# Patient Record
Sex: Female | Born: 1960 | Race: White | Hispanic: No | Marital: Married
Health system: Southern US, Community
[De-identification: ages and names within clinical notes are randomized; demographics above are authoritative.]

---

## 2017-10-26 DIAGNOSIS — M65312 Trigger thumb, left thumb: Secondary | ICD-10-CM | POA: Diagnosis not present

## 2017-10-26 DIAGNOSIS — M79642 Pain in left hand: Secondary | ICD-10-CM | POA: Diagnosis not present

## 2017-10-26 DIAGNOSIS — M1812 Unilateral primary osteoarthritis of first carpometacarpal joint, left hand: Secondary | ICD-10-CM | POA: Diagnosis not present

## 2017-11-08 DIAGNOSIS — Z1231 Encounter for screening mammogram for malignant neoplasm of breast: Secondary | ICD-10-CM | POA: Diagnosis not present

## 2017-11-08 DIAGNOSIS — Z1331 Encounter for screening for depression: Secondary | ICD-10-CM | POA: Diagnosis not present

## 2017-11-08 DIAGNOSIS — N951 Menopausal and female climacteric states: Secondary | ICD-10-CM | POA: Diagnosis not present

## 2017-11-08 DIAGNOSIS — Z1211 Encounter for screening for malignant neoplasm of colon: Secondary | ICD-10-CM | POA: Diagnosis not present

## 2017-11-08 DIAGNOSIS — Z0000 Encounter for general adult medical examination without abnormal findings: Secondary | ICD-10-CM | POA: Diagnosis not present

## 2017-11-23 DIAGNOSIS — Z1231 Encounter for screening mammogram for malignant neoplasm of breast: Secondary | ICD-10-CM | POA: Diagnosis not present

## 2018-05-02 DIAGNOSIS — N3000 Acute cystitis without hematuria: Secondary | ICD-10-CM | POA: Diagnosis not present

## 2018-05-02 DIAGNOSIS — E663 Overweight: Secondary | ICD-10-CM | POA: Diagnosis not present

## 2018-05-02 DIAGNOSIS — Z780 Asymptomatic menopausal state: Secondary | ICD-10-CM | POA: Diagnosis not present

## 2018-05-06 DIAGNOSIS — N390 Urinary tract infection, site not specified: Secondary | ICD-10-CM | POA: Diagnosis not present

## 2018-05-06 DIAGNOSIS — Z79899 Other long term (current) drug therapy: Secondary | ICD-10-CM | POA: Diagnosis not present

## 2018-05-06 DIAGNOSIS — N951 Menopausal and female climacteric states: Secondary | ICD-10-CM | POA: Diagnosis not present

## 2018-05-06 DIAGNOSIS — B373 Candidiasis of vulva and vagina: Secondary | ICD-10-CM | POA: Diagnosis not present

## 2018-05-13 DIAGNOSIS — N390 Urinary tract infection, site not specified: Secondary | ICD-10-CM | POA: Diagnosis not present

## 2018-05-25 DIAGNOSIS — M25631 Stiffness of right wrist, not elsewhere classified: Secondary | ICD-10-CM | POA: Diagnosis not present

## 2018-05-25 DIAGNOSIS — M25531 Pain in right wrist: Secondary | ICD-10-CM | POA: Diagnosis not present

## 2018-07-05 DIAGNOSIS — M25531 Pain in right wrist: Secondary | ICD-10-CM | POA: Diagnosis not present

## 2018-07-05 DIAGNOSIS — M25631 Stiffness of right wrist, not elsewhere classified: Secondary | ICD-10-CM | POA: Diagnosis not present

## 2018-07-06 DIAGNOSIS — M25531 Pain in right wrist: Secondary | ICD-10-CM | POA: Diagnosis not present

## 2018-07-06 DIAGNOSIS — M25631 Stiffness of right wrist, not elsewhere classified: Secondary | ICD-10-CM | POA: Diagnosis not present

## 2018-07-12 DIAGNOSIS — M25631 Stiffness of right wrist, not elsewhere classified: Secondary | ICD-10-CM | POA: Diagnosis not present

## 2018-07-12 DIAGNOSIS — M25531 Pain in right wrist: Secondary | ICD-10-CM | POA: Diagnosis not present

## 2018-09-07 DIAGNOSIS — J028 Acute pharyngitis due to other specified organisms: Secondary | ICD-10-CM | POA: Diagnosis not present

## 2018-09-07 DIAGNOSIS — Z6824 Body mass index (BMI) 24.0-24.9, adult: Secondary | ICD-10-CM | POA: Diagnosis not present

## 2018-11-28 DIAGNOSIS — Z1231 Encounter for screening mammogram for malignant neoplasm of breast: Secondary | ICD-10-CM | POA: Diagnosis not present

## 2018-11-28 DIAGNOSIS — Z1331 Encounter for screening for depression: Secondary | ICD-10-CM | POA: Diagnosis not present

## 2018-11-28 DIAGNOSIS — Z0000 Encounter for general adult medical examination without abnormal findings: Secondary | ICD-10-CM | POA: Diagnosis not present

## 2018-11-28 DIAGNOSIS — E2839 Other primary ovarian failure: Secondary | ICD-10-CM | POA: Diagnosis not present

## 2018-12-30 DIAGNOSIS — Z1231 Encounter for screening mammogram for malignant neoplasm of breast: Secondary | ICD-10-CM | POA: Diagnosis not present

## 2018-12-30 DIAGNOSIS — M8589 Other specified disorders of bone density and structure, multiple sites: Secondary | ICD-10-CM | POA: Diagnosis not present

## 2018-12-30 DIAGNOSIS — Z13820 Encounter for screening for osteoporosis: Secondary | ICD-10-CM | POA: Diagnosis not present

## 2018-12-31 DIAGNOSIS — Z23 Encounter for immunization: Secondary | ICD-10-CM | POA: Diagnosis not present

## 2019-02-04 DIAGNOSIS — Z885 Allergy status to narcotic agent status: Secondary | ICD-10-CM | POA: Diagnosis not present

## 2019-02-04 DIAGNOSIS — M47812 Spondylosis without myelopathy or radiculopathy, cervical region: Secondary | ICD-10-CM | POA: Diagnosis not present

## 2019-02-04 DIAGNOSIS — S4992XA Unspecified injury of left shoulder and upper arm, initial encounter: Secondary | ICD-10-CM | POA: Diagnosis not present

## 2019-02-04 DIAGNOSIS — Y92814 Boat as the place of occurrence of the external cause: Secondary | ICD-10-CM | POA: Diagnosis not present

## 2019-02-04 DIAGNOSIS — M542 Cervicalgia: Secondary | ICD-10-CM | POA: Diagnosis not present

## 2019-02-04 DIAGNOSIS — K0889 Other specified disorders of teeth and supporting structures: Secondary | ICD-10-CM | POA: Diagnosis not present

## 2019-02-04 DIAGNOSIS — S0993XA Unspecified injury of face, initial encounter: Secondary | ICD-10-CM | POA: Diagnosis not present

## 2019-02-04 DIAGNOSIS — X58XXXA Exposure to other specified factors, initial encounter: Secondary | ICD-10-CM | POA: Diagnosis not present

## 2019-02-04 DIAGNOSIS — R6884 Jaw pain: Secondary | ICD-10-CM | POA: Diagnosis not present

## 2019-02-04 DIAGNOSIS — S0083XA Contusion of other part of head, initial encounter: Secondary | ICD-10-CM | POA: Diagnosis not present

## 2019-02-04 DIAGNOSIS — M25512 Pain in left shoulder: Secondary | ICD-10-CM | POA: Diagnosis not present

## 2019-02-04 DIAGNOSIS — Z791 Long term (current) use of non-steroidal anti-inflammatories (NSAID): Secondary | ICD-10-CM | POA: Diagnosis not present

## 2019-02-08 DIAGNOSIS — Z780 Asymptomatic menopausal state: Secondary | ICD-10-CM | POA: Diagnosis not present

## 2019-02-08 DIAGNOSIS — Z6823 Body mass index (BMI) 23.0-23.9, adult: Secondary | ICD-10-CM | POA: Diagnosis not present

## 2019-02-08 DIAGNOSIS — B373 Candidiasis of vulva and vagina: Secondary | ICD-10-CM | POA: Diagnosis not present

## 2019-03-08 DIAGNOSIS — M542 Cervicalgia: Secondary | ICD-10-CM | POA: Diagnosis not present

## 2019-03-20 DIAGNOSIS — M542 Cervicalgia: Secondary | ICD-10-CM | POA: Diagnosis not present

## 2019-03-20 DIAGNOSIS — M2560 Stiffness of unspecified joint, not elsewhere classified: Secondary | ICD-10-CM | POA: Diagnosis not present

## 2019-03-28 DIAGNOSIS — M2560 Stiffness of unspecified joint, not elsewhere classified: Secondary | ICD-10-CM | POA: Diagnosis not present

## 2019-03-28 DIAGNOSIS — M542 Cervicalgia: Secondary | ICD-10-CM | POA: Diagnosis not present

## 2019-03-30 DIAGNOSIS — Z780 Asymptomatic menopausal state: Secondary | ICD-10-CM | POA: Diagnosis not present

## 2019-03-30 DIAGNOSIS — B373 Candidiasis of vulva and vagina: Secondary | ICD-10-CM | POA: Diagnosis not present

## 2019-03-30 DIAGNOSIS — Z6823 Body mass index (BMI) 23.0-23.9, adult: Secondary | ICD-10-CM | POA: Diagnosis not present

## 2019-04-05 DIAGNOSIS — M542 Cervicalgia: Secondary | ICD-10-CM | POA: Diagnosis not present

## 2019-04-05 DIAGNOSIS — M2560 Stiffness of unspecified joint, not elsewhere classified: Secondary | ICD-10-CM | POA: Diagnosis not present

## 2019-04-11 DIAGNOSIS — M2560 Stiffness of unspecified joint, not elsewhere classified: Secondary | ICD-10-CM | POA: Diagnosis not present

## 2019-04-11 DIAGNOSIS — M542 Cervicalgia: Secondary | ICD-10-CM | POA: Diagnosis not present

## 2019-04-12 DIAGNOSIS — M542 Cervicalgia: Secondary | ICD-10-CM | POA: Diagnosis not present

## 2019-04-19 DIAGNOSIS — M50121 Cervical disc disorder at C4-C5 level with radiculopathy: Secondary | ICD-10-CM | POA: Diagnosis not present

## 2019-04-19 DIAGNOSIS — M542 Cervicalgia: Secondary | ICD-10-CM | POA: Diagnosis not present

## 2019-04-28 DIAGNOSIS — M542 Cervicalgia: Secondary | ICD-10-CM | POA: Diagnosis not present

## 2019-05-02 ENCOUNTER — Other Ambulatory Visit: Payer: Self-pay | Admitting: Physician Assistant

## 2019-05-02 DIAGNOSIS — M542 Cervicalgia: Secondary | ICD-10-CM

## 2019-05-09 DIAGNOSIS — B373 Candidiasis of vulva and vagina: Secondary | ICD-10-CM | POA: Diagnosis not present

## 2019-05-09 DIAGNOSIS — R300 Dysuria: Secondary | ICD-10-CM | POA: Diagnosis not present

## 2019-05-09 DIAGNOSIS — N951 Menopausal and female climacteric states: Secondary | ICD-10-CM | POA: Diagnosis not present

## 2019-05-26 ENCOUNTER — Other Ambulatory Visit: Payer: Self-pay

## 2019-05-26 ENCOUNTER — Ambulatory Visit
Admission: RE | Admit: 2019-05-26 | Discharge: 2019-05-26 | Disposition: A | Discharge status: Home / Self Care | Payer: BC Managed Care – PPO | Source: Ambulatory Visit | Attending: Physician Assistant | Admitting: Physician Assistant

## 2019-05-26 DIAGNOSIS — M542 Cervicalgia: Secondary | ICD-10-CM

## 2019-05-26 MED ORDER — IOPAMIDOL (ISOVUE-M 300) INJECTION 61%
1.0000 mL | Freq: Once | INTRAMUSCULAR | Status: AC | PRN
  Administered 2019-05-26: 15:00:00 1 mL via EPIDURAL

## 2019-05-26 MED ORDER — TRIAMCINOLONE ACETONIDE 40 MG/ML IJ SUSP (RADIOLOGY)
60.0000 mg | Freq: Once | INTRAMUSCULAR | Status: AC
  Administered 2019-05-26: 60 mg via EPIDURAL

## 2019-05-26 NOTE — Discharge Instructions (Signed)

## 2019-06-27 DIAGNOSIS — M7542 Impingement syndrome of left shoulder: Secondary | ICD-10-CM | POA: Diagnosis not present

## 2019-06-27 DIAGNOSIS — M542 Cervicalgia: Secondary | ICD-10-CM | POA: Diagnosis not present

## 2019-07-17 DIAGNOSIS — Z23 Encounter for immunization: Secondary | ICD-10-CM | POA: Diagnosis not present

## 2019-08-01 DIAGNOSIS — M25512 Pain in left shoulder: Secondary | ICD-10-CM | POA: Diagnosis not present

## 2019-08-01 DIAGNOSIS — M542 Cervicalgia: Secondary | ICD-10-CM | POA: Diagnosis not present

## 2019-08-11 DIAGNOSIS — M50122 Cervical disc disorder at C5-C6 level with radiculopathy: Secondary | ICD-10-CM | POA: Diagnosis not present

## 2019-08-11 DIAGNOSIS — M50121 Cervical disc disorder at C4-C5 level with radiculopathy: Secondary | ICD-10-CM | POA: Diagnosis not present

## 2019-08-11 DIAGNOSIS — M542 Cervicalgia: Secondary | ICD-10-CM | POA: Diagnosis not present

## 2019-08-11 DIAGNOSIS — M50123 Cervical disc disorder at C6-C7 level with radiculopathy: Secondary | ICD-10-CM | POA: Diagnosis not present

## 2019-08-18 DIAGNOSIS — E782 Mixed hyperlipidemia: Secondary | ICD-10-CM | POA: Diagnosis not present

## 2019-08-18 DIAGNOSIS — D225 Melanocytic nevi of trunk: Secondary | ICD-10-CM | POA: Diagnosis not present

## 2019-08-18 DIAGNOSIS — L821 Other seborrheic keratosis: Secondary | ICD-10-CM | POA: Diagnosis not present

## 2019-08-18 DIAGNOSIS — Z6822 Body mass index (BMI) 22.0-22.9, adult: Secondary | ICD-10-CM | POA: Diagnosis not present

## 2019-08-18 DIAGNOSIS — Z780 Asymptomatic menopausal state: Secondary | ICD-10-CM | POA: Diagnosis not present

## 2019-08-18 DIAGNOSIS — L578 Other skin changes due to chronic exposure to nonionizing radiation: Secondary | ICD-10-CM | POA: Diagnosis not present

## 2019-09-12 DIAGNOSIS — M25512 Pain in left shoulder: Secondary | ICD-10-CM | POA: Diagnosis not present

## 2019-09-12 DIAGNOSIS — M542 Cervicalgia: Secondary | ICD-10-CM | POA: Diagnosis not present

## 2019-09-19 DIAGNOSIS — M542 Cervicalgia: Secondary | ICD-10-CM | POA: Diagnosis not present

## 2019-09-21 DIAGNOSIS — M542 Cervicalgia: Secondary | ICD-10-CM | POA: Diagnosis not present

## 2019-09-26 DIAGNOSIS — M542 Cervicalgia: Secondary | ICD-10-CM | POA: Diagnosis not present

## 2019-09-26 DIAGNOSIS — M5412 Radiculopathy, cervical region: Secondary | ICD-10-CM | POA: Diagnosis not present

## 2019-10-23 DIAGNOSIS — M542 Cervicalgia: Secondary | ICD-10-CM | POA: Diagnosis not present

## 2019-10-23 DIAGNOSIS — M47812 Spondylosis without myelopathy or radiculopathy, cervical region: Secondary | ICD-10-CM | POA: Diagnosis not present

## 2019-10-23 DIAGNOSIS — M4312 Spondylolisthesis, cervical region: Secondary | ICD-10-CM | POA: Diagnosis not present

## 2019-10-23 DIAGNOSIS — M5412 Radiculopathy, cervical region: Secondary | ICD-10-CM | POA: Diagnosis not present

## 2019-11-09 DIAGNOSIS — B373 Candidiasis of vulva and vagina: Secondary | ICD-10-CM | POA: Diagnosis not present

## 2019-11-09 DIAGNOSIS — N390 Urinary tract infection, site not specified: Secondary | ICD-10-CM | POA: Diagnosis not present

## 2019-11-09 DIAGNOSIS — N951 Menopausal and female climacteric states: Secondary | ICD-10-CM | POA: Diagnosis not present

## 2019-11-30 DIAGNOSIS — Z1331 Encounter for screening for depression: Secondary | ICD-10-CM | POA: Diagnosis not present

## 2019-11-30 DIAGNOSIS — G4700 Insomnia, unspecified: Secondary | ICD-10-CM | POA: Diagnosis not present

## 2019-11-30 DIAGNOSIS — Z13220 Encounter for screening for lipoid disorders: Secondary | ICD-10-CM | POA: Diagnosis not present

## 2019-11-30 DIAGNOSIS — Z0000 Encounter for general adult medical examination without abnormal findings: Secondary | ICD-10-CM | POA: Diagnosis not present

## 2019-11-30 DIAGNOSIS — Z1231 Encounter for screening mammogram for malignant neoplasm of breast: Secondary | ICD-10-CM | POA: Diagnosis not present

## 2020-01-15 DIAGNOSIS — Z1231 Encounter for screening mammogram for malignant neoplasm of breast: Secondary | ICD-10-CM | POA: Diagnosis not present

## 2021-01-07 IMAGING — XA DG INJECT/[PERSON_NAME] INC NEEDLE/CATH/PLC EPI/CERV/THOR W/IMG
2 series · 2 of 2 positions shown · non-contrast
Comparison: none

CLINICAL DATA: Cervical pain. Pain in the left neck to the
shoulder. Radiculopathy. Displacement of the cervical discs C4-5
C5-6, and C6-7.

[Series 1: ortho standard · 1 of 1 slices shown (1 of 2)]
[im 1/1]
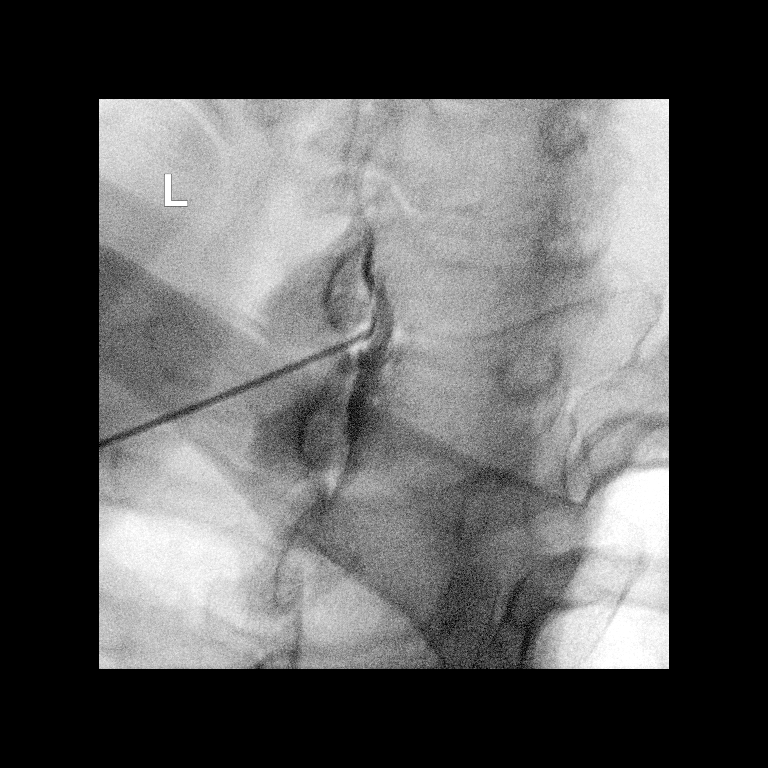

[Series 2: ortho standard · 1 of 1 slices shown (2 of 2)]
[im 1/1]
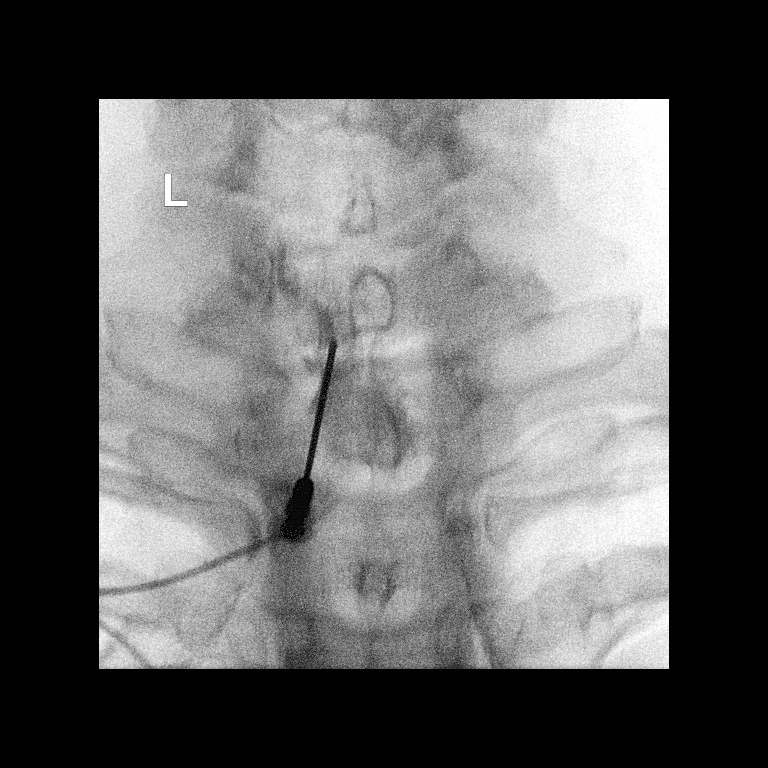

[2 of 2 positions shown; findings below may reference images not displayed]

FLUOROSCOPY TIME:  Radiation Exposure Index (as provided by the
fluoroscopic device): 3.12 uGy*m2

Fluoroscopy Time:  12 seconds

Number of Acquired Images:  0

PROCEDURE:
CERVICAL EPIDURAL INJECTION

An interlaminar approach was performed on the left at C7-T1. A 20
gauge epidural needle was advanced using loss-of-resistance
technique.

DIAGNOSTIC EPIDURAL INJECTION

Injection of Isovue-M 300 shows a good epidural pattern with spread
above and below the level of needle placement, primarily on the
left. No vascular opacification is seen. THERAPEUTIC

EPIDURAL INJECTION

1.5 ml of Kenalog 40 mixed with 1 ml of 1% Lidocaine and 2 ml of
normal saline were then instilled. The procedure was well-tolerated,
and the patient was discharged thirty minutes following the
injection in good condition.
IMPRESSION: Technically successful first epidural injection on the left at
C7-T1.

## 2022-03-10 DIAGNOSIS — Z1231 Encounter for screening mammogram for malignant neoplasm of breast: Secondary | ICD-10-CM | POA: Diagnosis not present

## 2022-03-13 DIAGNOSIS — E7800 Pure hypercholesterolemia, unspecified: Secondary | ICD-10-CM | POA: Diagnosis not present

## 2022-03-13 DIAGNOSIS — M47812 Spondylosis without myelopathy or radiculopathy, cervical region: Secondary | ICD-10-CM | POA: Diagnosis not present

## 2022-03-13 DIAGNOSIS — Z6823 Body mass index (BMI) 23.0-23.9, adult: Secondary | ICD-10-CM | POA: Diagnosis not present

## 2022-03-13 DIAGNOSIS — M545 Low back pain, unspecified: Secondary | ICD-10-CM | POA: Diagnosis not present

## 2022-03-13 DIAGNOSIS — K219 Gastro-esophageal reflux disease without esophagitis: Secondary | ICD-10-CM | POA: Diagnosis not present

## 2022-03-19 DIAGNOSIS — C44311 Basal cell carcinoma of skin of nose: Secondary | ICD-10-CM | POA: Diagnosis not present

## 2022-04-03 DIAGNOSIS — Z1211 Encounter for screening for malignant neoplasm of colon: Secondary | ICD-10-CM | POA: Diagnosis not present

## 2022-04-03 DIAGNOSIS — Z86010 Personal history of colonic polyps: Secondary | ICD-10-CM | POA: Diagnosis not present

## 2022-04-24 DIAGNOSIS — Z0000 Encounter for general adult medical examination without abnormal findings: Secondary | ICD-10-CM | POA: Diagnosis not present

## 2022-04-24 DIAGNOSIS — Z85828 Personal history of other malignant neoplasm of skin: Secondary | ICD-10-CM | POA: Diagnosis not present

## 2022-04-24 DIAGNOSIS — E7800 Pure hypercholesterolemia, unspecified: Secondary | ICD-10-CM | POA: Diagnosis not present

## 2022-04-24 DIAGNOSIS — G4700 Insomnia, unspecified: Secondary | ICD-10-CM | POA: Diagnosis not present

## 2022-04-24 DIAGNOSIS — Z780 Asymptomatic menopausal state: Secondary | ICD-10-CM | POA: Diagnosis not present

## 2022-04-24 DIAGNOSIS — K219 Gastro-esophageal reflux disease without esophagitis: Secondary | ICD-10-CM | POA: Diagnosis not present

## 2022-04-24 DIAGNOSIS — Z131 Encounter for screening for diabetes mellitus: Secondary | ICD-10-CM | POA: Diagnosis not present

## 2022-04-24 DIAGNOSIS — Z79899 Other long term (current) drug therapy: Secondary | ICD-10-CM | POA: Diagnosis not present

## 2022-04-24 DIAGNOSIS — M47812 Spondylosis without myelopathy or radiculopathy, cervical region: Secondary | ICD-10-CM | POA: Diagnosis not present

## 2022-07-17 DIAGNOSIS — J180 Bronchopneumonia, unspecified organism: Secondary | ICD-10-CM | POA: Diagnosis not present

## 2022-07-30 DIAGNOSIS — N951 Menopausal and female climacteric states: Secondary | ICD-10-CM | POA: Diagnosis not present

## 2022-07-30 DIAGNOSIS — Z79899 Other long term (current) drug therapy: Secondary | ICD-10-CM | POA: Diagnosis not present

## 2022-07-30 DIAGNOSIS — N390 Urinary tract infection, site not specified: Secondary | ICD-10-CM | POA: Diagnosis not present

## 2022-10-06 DIAGNOSIS — L821 Other seborrheic keratosis: Secondary | ICD-10-CM | POA: Diagnosis not present

## 2022-10-06 DIAGNOSIS — L814 Other melanin hyperpigmentation: Secondary | ICD-10-CM | POA: Diagnosis not present

## 2022-10-06 DIAGNOSIS — D1801 Hemangioma of skin and subcutaneous tissue: Secondary | ICD-10-CM | POA: Diagnosis not present

## 2022-10-06 DIAGNOSIS — L570 Actinic keratosis: Secondary | ICD-10-CM | POA: Diagnosis not present

## 2022-12-17 DIAGNOSIS — R42 Dizziness and giddiness: Secondary | ICD-10-CM | POA: Diagnosis not present

## 2022-12-17 DIAGNOSIS — M791 Myalgia, unspecified site: Secondary | ICD-10-CM | POA: Diagnosis not present

## 2022-12-17 DIAGNOSIS — R5381 Other malaise: Secondary | ICD-10-CM | POA: Diagnosis not present

## 2022-12-17 DIAGNOSIS — R070 Pain in throat: Secondary | ICD-10-CM | POA: Diagnosis not present

## 2022-12-17 DIAGNOSIS — Z6824 Body mass index (BMI) 24.0-24.9, adult: Secondary | ICD-10-CM | POA: Diagnosis not present

## 2023-02-12 DIAGNOSIS — N390 Urinary tract infection, site not specified: Secondary | ICD-10-CM | POA: Diagnosis not present

## 2023-02-12 DIAGNOSIS — Z79899 Other long term (current) drug therapy: Secondary | ICD-10-CM | POA: Diagnosis not present

## 2023-02-12 DIAGNOSIS — B3731 Acute candidiasis of vulva and vagina: Secondary | ICD-10-CM | POA: Diagnosis not present

## 2023-02-12 DIAGNOSIS — N951 Menopausal and female climacteric states: Secondary | ICD-10-CM | POA: Diagnosis not present

## 2023-03-15 DIAGNOSIS — Z1231 Encounter for screening mammogram for malignant neoplasm of breast: Secondary | ICD-10-CM | POA: Diagnosis not present

## 2023-05-10 DIAGNOSIS — Z79899 Other long term (current) drug therapy: Secondary | ICD-10-CM | POA: Diagnosis not present

## 2023-05-10 DIAGNOSIS — Z0000 Encounter for general adult medical examination without abnormal findings: Secondary | ICD-10-CM | POA: Diagnosis not present

## 2023-05-10 DIAGNOSIS — Z1331 Encounter for screening for depression: Secondary | ICD-10-CM | POA: Diagnosis not present

## 2023-05-10 DIAGNOSIS — K219 Gastro-esophageal reflux disease without esophagitis: Secondary | ICD-10-CM | POA: Diagnosis not present

## 2023-05-10 DIAGNOSIS — Z124 Encounter for screening for malignant neoplasm of cervix: Secondary | ICD-10-CM | POA: Diagnosis not present

## 2023-05-10 DIAGNOSIS — Z1339 Encounter for screening examination for other mental health and behavioral disorders: Secondary | ICD-10-CM | POA: Diagnosis not present

## 2023-05-10 DIAGNOSIS — Z6823 Body mass index (BMI) 23.0-23.9, adult: Secondary | ICD-10-CM | POA: Diagnosis not present

## 2023-05-10 DIAGNOSIS — E7800 Pure hypercholesterolemia, unspecified: Secondary | ICD-10-CM | POA: Diagnosis not present

## 2023-07-14 DIAGNOSIS — J329 Chronic sinusitis, unspecified: Secondary | ICD-10-CM | POA: Diagnosis not present

## 2023-07-14 DIAGNOSIS — U071 COVID-19: Secondary | ICD-10-CM | POA: Diagnosis not present

## 2023-07-14 DIAGNOSIS — J40 Bronchitis, not specified as acute or chronic: Secondary | ICD-10-CM | POA: Diagnosis not present

## 2023-07-14 DIAGNOSIS — R110 Nausea: Secondary | ICD-10-CM | POA: Diagnosis not present

## 2023-10-12 DIAGNOSIS — L918 Other hypertrophic disorders of the skin: Secondary | ICD-10-CM | POA: Diagnosis not present

## 2023-10-12 DIAGNOSIS — L821 Other seborrheic keratosis: Secondary | ICD-10-CM | POA: Diagnosis not present

## 2023-10-12 DIAGNOSIS — L814 Other melanin hyperpigmentation: Secondary | ICD-10-CM | POA: Diagnosis not present

## 2023-10-12 DIAGNOSIS — D1801 Hemangioma of skin and subcutaneous tissue: Secondary | ICD-10-CM | POA: Diagnosis not present

## 2023-10-12 DIAGNOSIS — L719 Rosacea, unspecified: Secondary | ICD-10-CM | POA: Diagnosis not present

## 2024-04-06 DIAGNOSIS — Z85828 Personal history of other malignant neoplasm of skin: Secondary | ICD-10-CM | POA: Diagnosis not present

## 2024-04-06 DIAGNOSIS — L578 Other skin changes due to chronic exposure to nonionizing radiation: Secondary | ICD-10-CM | POA: Diagnosis not present

## 2024-04-06 DIAGNOSIS — L918 Other hypertrophic disorders of the skin: Secondary | ICD-10-CM | POA: Diagnosis not present

## 2024-04-06 DIAGNOSIS — L719 Rosacea, unspecified: Secondary | ICD-10-CM | POA: Diagnosis not present

## 2024-04-13 DIAGNOSIS — Z1231 Encounter for screening mammogram for malignant neoplasm of breast: Secondary | ICD-10-CM | POA: Diagnosis not present

## 2024-06-06 DIAGNOSIS — E7800 Pure hypercholesterolemia, unspecified: Secondary | ICD-10-CM | POA: Diagnosis not present

## 2024-06-06 DIAGNOSIS — Z79899 Other long term (current) drug therapy: Secondary | ICD-10-CM | POA: Diagnosis not present

## 2024-06-06 DIAGNOSIS — K219 Gastro-esophageal reflux disease without esophagitis: Secondary | ICD-10-CM | POA: Diagnosis not present

## 2024-06-06 DIAGNOSIS — F411 Generalized anxiety disorder: Secondary | ICD-10-CM | POA: Diagnosis not present

## 2024-06-06 DIAGNOSIS — Z6824 Body mass index (BMI) 24.0-24.9, adult: Secondary | ICD-10-CM | POA: Diagnosis not present

## 2024-06-06 DIAGNOSIS — Z0000 Encounter for general adult medical examination without abnormal findings: Secondary | ICD-10-CM | POA: Diagnosis not present

## 2024-06-06 DIAGNOSIS — Z1339 Encounter for screening examination for other mental health and behavioral disorders: Secondary | ICD-10-CM | POA: Diagnosis not present

## 2024-06-06 DIAGNOSIS — Z1331 Encounter for screening for depression: Secondary | ICD-10-CM | POA: Diagnosis not present

## 2024-09-06 DIAGNOSIS — M1990 Unspecified osteoarthritis, unspecified site: Secondary | ICD-10-CM | POA: Diagnosis not present

## 2024-09-06 DIAGNOSIS — K219 Gastro-esophageal reflux disease without esophagitis: Secondary | ICD-10-CM | POA: Diagnosis not present

## 2024-09-06 DIAGNOSIS — E785 Hyperlipidemia, unspecified: Secondary | ICD-10-CM | POA: Diagnosis not present

## 2024-09-06 DIAGNOSIS — Z833 Family history of diabetes mellitus: Secondary | ICD-10-CM | POA: Diagnosis not present

## 2024-09-06 DIAGNOSIS — N959 Unspecified menopausal and perimenopausal disorder: Secondary | ICD-10-CM | POA: Diagnosis not present

## 2024-09-06 DIAGNOSIS — F419 Anxiety disorder, unspecified: Secondary | ICD-10-CM | POA: Diagnosis not present

## 2024-09-06 DIAGNOSIS — Z8249 Family history of ischemic heart disease and other diseases of the circulatory system: Secondary | ICD-10-CM | POA: Diagnosis not present

## 2024-09-06 DIAGNOSIS — Z59868 Other specified financial insecurity: Secondary | ICD-10-CM | POA: Diagnosis not present

## 2024-09-06 DIAGNOSIS — M545 Low back pain, unspecified: Secondary | ICD-10-CM | POA: Diagnosis not present

## 2024-09-06 DIAGNOSIS — R030 Elevated blood-pressure reading, without diagnosis of hypertension: Secondary | ICD-10-CM | POA: Diagnosis not present
